# Patient Record
Sex: Female | Born: 1935 | Race: White | Hispanic: No | Marital: Married | Smoking: Former smoker
Health system: Southern US, Community
[De-identification: ages and names within clinical notes are randomized; demographics above are authoritative.]

## PROBLEM LIST (undated history)

## (undated) DIAGNOSIS — M797 Fibromyalgia: Secondary | ICD-10-CM

## (undated) HISTORY — PX: APPENDECTOMY: SHX54

## (undated) HISTORY — PX: PACEMAKER INSERTION: SHX728

## (undated) HISTORY — PX: CHOLECYSTECTOMY: SHX55

## (undated) HISTORY — PX: ABDOMINAL HYSTERECTOMY: SHX81

## (undated) HISTORY — DX: Fibromyalgia: M79.7

## (undated) HISTORY — PX: REPLACEMENT TOTAL KNEE: SUR1224

---

## 2000-08-13 ENCOUNTER — Ambulatory Visit (HOSPITAL_BASED_OUTPATIENT_CLINIC_OR_DEPARTMENT_OTHER): Admission: RE | Admit: 2000-08-13 | Discharge: 2000-08-14 | Payer: Self-pay | Admitting: Orthopedic Surgery

## 2008-03-24 ENCOUNTER — Encounter: Admission: RE | Admit: 2008-03-24 | Discharge: 2008-03-24 | Payer: Self-pay | Admitting: Orthopedic Surgery

## 2008-04-08 ENCOUNTER — Ambulatory Visit (HOSPITAL_BASED_OUTPATIENT_CLINIC_OR_DEPARTMENT_OTHER): Admission: RE | Admit: 2008-04-08 | Discharge: 2008-04-09 | Payer: Self-pay | Admitting: Orthopedic Surgery

## 2010-10-24 NOTE — Op Note (Signed)
[name], [name]                 ACCOUNT NO.:  [id number]   MEDICAL RECORD NO.:  [id number]          PATIENT TYPE:  AMB   LOCATION:  DSC                          FACILITY:  [facility]   PHYSICIAN:  Katy Fitch. Sypher, M.D. DATE OF BIRTH:  July 30, 1935   DATE OF PROCEDURE:  04/08/2008  DATE OF DISCHARGE:                               OPERATIVE REPORT   PREOPERATIVE DIAGNOSES:  1. End-stage right thumb carpometacarpal arthrosis, status post years      of nonoperative management without successful pain relief.  2. Chronic right carpal tunnel syndrome.  3. Chronic right ulnar nerve compression at cubital tunnel.   POSTOPERATIVE DIAGNOSES:  1. End-stage right thumb carpometacarpal arthrosis, status post years      of nonoperative management without successful pain relief.  2. Chronic right carpal tunnel syndrome.  3. Chronic right ulnar nerve compression at cubital tunnel.   OPERATION:  1. Right thumb reconstruction by trapeziectomy and intermetacarpal      ligament reconstruction utilizing a distally-based flexor carpi      radialis tendon graft followed by interposition anchovy type      procedure with remnant of flexor carpi radialis.  2. Suture suspension plasty of right thumb metacarpal.  3. Decompression of right ulnar nerve at cubital tunnel through a      separate incision.  4. Right carpal tunnel release.   OPERATING SURGEON:  Katy Fitch. Sypher, MD   ASSISTANT:  Marveen Reeks Dasnoit, PA-C   ANESTHESIA:  General by LMA with a supplemental right infraclavicular  block.   SUPERVISING ANESTHESIOLOGIST:  Kaylyn Layer. 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  She has had a history of  bilateral thumb CMC arthrosis, status post reconstruction of her left  thumb in 2002.  Recently, we have treated her for right carpal tunnel  syndrome, right ulnar nerve compression at the cubital tunnel, and end-  stage right  thumb CMC arthrosis.   After years of deliberation, she decided to proceed with reconstruction  of right thumb CMC joint at this time.  Electodiagnostic studies reveal  right median and ulnar neuropathy unresponsive to non operative  measures.   On the left, she required stabilization of her thumb MP joint due to  hyperextension.  On the right, she has a stable right thumb MP joint.   After informed consent, she was brought to the operating room at this  time.   PROCEDURE:  [name] was brought to the operating room and placed in  supine position upon the operating table.   Following an anesthesia consult, an infraclavicular block was placed  with only partial anesthesia.   [name] was brought to room 4, placed in supine position upon the  operating table, and under Dr. Deirdre Priest direct supervision, general  anesthesia by LMA technique induced.   The right arm was prepped with Betadine soap solution and sterilely  draped.  Vancomycin 1 g was administered over 90 minutes as an IV  prophylactic antibiotic.   The arm was exsanguinated  with an Esmarch bandage.  Arterial tourniquet  was inflated to 230 mmHg.  Procedure commenced with a standard carpal  tunnel release through a small palmar incision.  Subcutaneous tissues  were carefully divided taking care to identify the palmar fascia.  This  was split longitudinally to reveal the common sensory branch of the  median nerve.  These were followed back to transverse carpal ligament,  which was then gently separated from median nerve.  The ligament was  released along its ulnar border extending into the distal forearm.  This  widely opened the carpal canal.  We noted that the palmaris longus was a  very small caliber; therefore, it was not suitable for tendon graft with  the intermetacarpal ligament reconstruction.  The carpal tunnel wound  was then inspected for bleeding points and repaired with intradermal  through a Prolene.    Attention was then directed to the medial right elbow.  A 4-cm incision  was fashioned posterior to the epicondyle.  We went through subcutaneous  tissue until the arcuate ligament was identified.  This released as was  [employer] band.  The ulnar nerve was decompressed in situ 6 cm above the  epicondyle and 6 cm distal to the epicondyle.  This wound was then  inspected for bleeding points and repaired with subcutaneous suture of 4-  0 Vicryl and intradermal through a Prolene suture.   Attention was then directed to the thumb.  A Wagner curvilinear incision  was fashioned exposing the thenar muscles.  Interval between the  abductor pollicis longus and extensor pollicis brevis was split  longitudinally beyond the capsule of the CMC joint.  The trapezium was  exposed subperiosteally, morcellized, and removed in piecemeal.  The  complete synovectomy of the joint was accomplished.  The radial artery  was protected.  Drill holes were created at appropriate angles to the  base of the thumb metacarpal and at the base of the index metacarpal.  Two-thirds the flexor carpi radialis was harvested at the  musculotendinous junction, brought distally with a Carroll tendon  passing forceps and then into the cavity created by resection of the  trapezium.  This was then invaginated into the drill hole in the index  metacarpal and secured with a 2-0 Vicryl suture to the extensor carpi  radialis brevis.  The thumb metacarpal was then suspended with a #1  Vicryl two strand suture brought from dorsal through the index  metacarpal to the base of the thumb metacarpal and sutured to the  abductor pollicis longus insertion at the base of the metacarpal.  The  flexor carpi radialis was then brought through the base of the thumb  metacarpal and secured to the abductor pollicis longus creating an  intermetacarpal ligament.  The 5-cm long remnant was repeatedly wrapped  around the remaining flexor carpi radialis and  sutured to the capsule of  the thumb carpometacarpal joint on its most volar aspect, creating an  excellent interposition.   This wound was then closed by repairing the 2 slips of the abductor  pollicis longus to each other with mattress suture of 3-0 Ethibond.  The  tourniquet was released and all bleeding controlled by bipolar forceps.  The skin wounds were repaired with subcutaneous suture of 4-0 Vicryl and  intradermal through a Prolene.   A voluminous dressing was applied.  The tourniquet released with  immediate capillary refill to the fingers and thumb.  There were no  apparent complications.  Katy Fitch Sypher, M.D.  Electronically Signed     RVS/MEDQ  D:  04/08/2008  T:  04/09/2008  Job:  [id number]

## 2010-10-27 NOTE — Op Note (Signed)
[facility]  Patient:    [name], WAHID                          MRN: [id number] Proc. Date: 08/13/00 Adm. Date:  [id number] Attending:  Susa Day                           Operative Report  PREOPERATIVE DIAGNOSIS:  Severe left thumb carpometacarpal joint arthrosis with bone-on-bone arthritis between thumb metacarpal and trapezium and 30 degree hyperextension instability of left thumb metacarpophalangeal joint.  POSTOPERATIVE DIAGNOSIS:  Severe left thumb carpometacarpal joint arthrosis with bone-on-bone arthritis between thumb metacarpal and trapezium and 30 degree hyperextension instability of left thumb metacarpophalangeal joint.  OPERATION PERFORMED: 1. Excision of left trapezium and reconstruction of intermetacarpal ligament    between index and thumb metacarpals with free palmaris longus tendon graft    reconstruction. 2. Abductor pollicis longus to second metacarpal transfer for suspension of    left thumb. 3. Through a separate incision, excision of ulnar sesamoid and abductor    pollicis advancement and volar plate plication to stabilize    metatarsophalangeal joint hyperextension. 4. 0.045 inch Kirschner wire fixation of left thumb metatarsophalangeal joint    at 35 degrees flexion.  SURGEON:  Katy Fitch. Sypher, Montez Hageman., M.D.  ASSISTANT:  Jonni Sanger, P.A.  ANESTHESIA:  Axillary block supplemented by intravenous sedation.  SUPERVISING ANESTHESIOLOGIST:  Dr. Diamantina Monks.  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  She has had a chronic history of bilateral thumb CMC arthrosis that has of late, severely impaired her ability to play music and to do household and professional activities with her hands.  She has sought a number of orthopedic consults including a consult with Dr. Chales Abrahams at the [facility] and was advised to  undergo soft tissue interposition arthroplasty reconstruction of her thumbs.  Unfortunately, due to scheduling difficulties, use of herbal medications and an episode of influenza she had to cancel her surgery x 2.  She researched hand surgeons who lived closer to her home in [place], [place] and sought an alternative hand surgery consult with our practice.  We advised them of our routine technique of soft tissue interposition reconstruction of the thumb and MP stabilization, given her anatomic circumstances.  They elected to transfer care to [facility] and at this time she is brought to the operating room for reconstruction of her left thumb.  Preoperatively, she was advised of potential risks and benefits of surgery including specific risks of development of reflex sympathetic dystrophy postoperatively, neurovascular injury during surgery, infection of her pin tracks or wound infection and possible disruption of her tendon graft reconstruction.  Accepting these risks and after answering all of her questions, she proceeds with reconstructive surgery at this time.  DESCRIPTION OF PROCEDURE:  [name] was brought to the operating room and placed in supine position on the operating table.  Following placement of an axillary block in the holding area by Dr. Katrinka Blazing, anesthesia was satisfactory in the median and ulnar distribution of the left arm, but somewhat spotty in the radial distribution.  After routine Betadine scrub and paint, the left arm was draped with sterile stockinet and arthroscopy drapes followed by exsanguination of the limb with Esmarch bandage and inflation of arterial tourniquet to 250 mmHg. Preoperatively, 1 gm of  vancomycin was administered as an IV prophylactic antibiotic.  After sensory testing, 2% lidocaine was infiltrated along the path of the radial sensory branches.  The procedure commenced with a curvilinear Wagner incision at the base of the thumb.  The  subcutaneous tissues were carefully divided taking care to identify the radial sensory branches.  The interval between the abductor pollicis longus and the extensor pollicis brevis tendons was sharply incised revealing the [facility] joint capsule.  The capsule was elevated along the distal margin of the trapezium and the trapezium was exposed circumferentially with subperiosteal dissection.  The trapesium was morselized with a rongeur and completely removed followed by complete synovectomy of the [facility] joint.  The osteophyte on the palmar aspect of the thumb metacarpal was removed with a rongeur.  The palmaris longus tendon was harvested through a separate transverse incision at the distal wrist flexion crease with a Brand tendon stripper.  This was technically satisfactory and a 5 mm palmaris longus was recovered.  This was cleared of all muscular tissue and placed in a saline soaked sponge for reconstruction at a later point in the procedure.  The wound was repaired with intradermal 3-0 Prolene.  An oblique drill hole was created through the base of the thumb metacarpal beginning 1 cm distal to the articular surface proximally and brought out through the base of the thumb metacarpal adjacent to the volar beak of the thumb metacarpal.  A second drill hole was created through the base of the index metacarpal utilizing the facet formerly articulating with the thumb metacarpal.  Both drill holes were enlarged to 4.5 mm with sequential drilling.  The palmaris longus was then looped through a dorsal incision at the base of the index metacarpal through the extensor carpi radialis longus and brought through the second metacarpal drill hole to the cavity created by the trapezium excision.  A long slip of this tendon was placed through the base of the thumb metacarpal, brought around 360 degrees through the drill hole at the base of the metacarpal creating a tendon loop at the base of the  thumb metacarpal.  The palmaris longus was then tensioned to properly suspend the thumb off of the index metacarpal and the second loop was passed through the hole at the  base of the thumb metacarpal and appropriately tensioned and sutured to the dorsal periosteum at the base of the thumb metacarpal.  One third of the abductor pollicis longus was released from the musculotendinous junction on the dorsal slip and split distally, brought through the first dorsal compartment and split to the base of the thumb metacarpal insertion.  This was then placed deep to the loop of the palmaris longus at the base of the thumb metacarpal and brought through the drill hole at the base of the second metacarpal.  This was properly tensioned to suspend the thumb in a satisfactory manner and subsequently secured into the extensor carpi radialis longus with a Pulvertaft weave x 3 and corner sutures of 3-0 Ethibond. A very satisfactory suspensionplasty was achieved.  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  The free tails of the tendon were then woven into the capsule and secured with multiple interrupted sutures of 3-0 Ethibond.  Attention was then directed to the thumb metacarpophalangeal joint.  A volar Brunner zigzag incision  was used to expose the flexor sheath overlying the oblique pulley and A-1 pulley.  The A-1 pulley was released and the flexor pollicis longus tendon retracted in a radial direction.  The ulnar sensorimotor was identified and a football-shaped portion of capsule and tendon overlying the ulnar sesamoid was excised with the sesamoid.  The MP joint was then  placed in 35 degrees flexion and secured with a 0.045 inch Kirschner wire followed by  advancement of the abductor pollicis tendon approximately 4 mm and securing it to the distal portion of the volar plate. The portion of the plate excised was advanced, imbricating the volar plate and securing the MP joint in flexion.  This should correct the tendency for hyperextension at the MP joint satisfactorily.  C-arm fluoroscopic images were obtained documenting satisfactory suspension of the the thumb metacarpal and proper position of the Kirschner wire at the thumb metacarpophalangeal joint.  The wounds were then thoroughly lavaged with sterile saline followed by triple antibiotic solution.  All skin wounds were then repaired with intradermal 3-0 Prolene and Steri-Strips.  The tourniquet was released at approximately one hour and 35 minutes with excellent capillary refill in all the fingers and thumb immediately.  The wounds were dressed with Xeroflo, sterile gauze and a volar and dorsal plaster splint protecting the thumb in a palmar abducted position.  Ace wrap was applied for light compression.  There were no apparent complications.  For aftercare, Ms. Cutbirth will be [facility] for observation of her vital signs and IV vancomycin as a prophylactic antibiotic x 24 hours. DD:  08/13/00 TD:  08/13/00 Job: [id number]

## 2011-03-13 LAB — POCT HEMOGLOBIN-HEMACUE: Hemoglobin: 14.3

## 2015-08-16 ENCOUNTER — Ambulatory Visit (HOSPITAL_COMMUNITY): Payer: Medicare Other | Admitting: Hematology & Oncology

## 2015-08-24 ENCOUNTER — Ambulatory Visit (HOSPITAL_COMMUNITY): Payer: Medicare Other | Admitting: Hematology & Oncology

## 2015-09-05 ENCOUNTER — Encounter (HOSPITAL_COMMUNITY): Payer: Self-pay | Admitting: Hematology & Oncology

## 2015-09-05 ENCOUNTER — Encounter (HOSPITAL_COMMUNITY): Payer: Medicare Other

## 2015-09-05 ENCOUNTER — Encounter (HOSPITAL_COMMUNITY): Payer: Medicare Other | Attending: Hematology & Oncology | Admitting: Hematology & Oncology

## 2015-09-05 VITALS — BP 159/75 | HR 88 | Temp 98.2°F | Resp 18 | Ht 63.0 in | Wt 176.0 lb

## 2015-09-05 DIAGNOSIS — Z950 Presence of cardiac pacemaker: Secondary | ICD-10-CM | POA: Diagnosis not present

## 2015-09-05 DIAGNOSIS — E8809 Other disorders of plasma-protein metabolism, not elsewhere classified: Secondary | ICD-10-CM

## 2015-09-05 DIAGNOSIS — M797 Fibromyalgia: Secondary | ICD-10-CM | POA: Diagnosis not present

## 2015-09-05 DIAGNOSIS — Z96659 Presence of unspecified artificial knee joint: Secondary | ICD-10-CM | POA: Insufficient documentation

## 2015-09-05 DIAGNOSIS — D72819 Decreased white blood cell count, unspecified: Secondary | ICD-10-CM

## 2015-09-05 DIAGNOSIS — Z79899 Other long term (current) drug therapy: Secondary | ICD-10-CM | POA: Diagnosis not present

## 2015-09-05 DIAGNOSIS — Z87891 Personal history of nicotine dependence: Secondary | ICD-10-CM | POA: Diagnosis not present

## 2015-09-05 DIAGNOSIS — Z880 Allergy status to penicillin: Secondary | ICD-10-CM | POA: Insufficient documentation

## 2015-09-05 DIAGNOSIS — D72810 Lymphocytopenia: Secondary | ICD-10-CM | POA: Insufficient documentation

## 2015-09-05 DIAGNOSIS — K7460 Unspecified cirrhosis of liver: Secondary | ICD-10-CM | POA: Insufficient documentation

## 2015-09-05 DIAGNOSIS — Z9889 Other specified postprocedural states: Secondary | ICD-10-CM | POA: Insufficient documentation

## 2015-09-05 DIAGNOSIS — Z888 Allergy status to other drugs, medicaments and biological substances status: Secondary | ICD-10-CM | POA: Insufficient documentation

## 2015-09-05 DIAGNOSIS — R17 Unspecified jaundice: Secondary | ICD-10-CM

## 2015-09-05 DIAGNOSIS — D693 Immune thrombocytopenic purpura: Secondary | ICD-10-CM | POA: Diagnosis present

## 2015-09-05 DIAGNOSIS — D696 Thrombocytopenia, unspecified: Secondary | ICD-10-CM

## 2015-09-05 DIAGNOSIS — F329 Major depressive disorder, single episode, unspecified: Secondary | ICD-10-CM | POA: Insufficient documentation

## 2015-09-05 LAB — CBC
HCT: 43 % (ref 36.0–46.0)
HEMOGLOBIN: 14.2 g/dL (ref 12.0–15.0)
MCH: 30.2 pg (ref 26.0–34.0)
MCHC: 33 g/dL (ref 30.0–36.0)
MCV: 91.5 fL (ref 78.0–100.0)
PLATELETS: 42 10*3/uL — AB (ref 150–400)
RBC: 4.7 MIL/uL (ref 3.87–5.11)
RDW: 14.8 % (ref 11.5–15.5)
WBC: 4 10*3/uL (ref 4.0–10.5)

## 2015-09-05 LAB — DIFFERENTIAL
Basophils Absolute: 0 10*3/uL (ref 0.0–0.1)
Basophils Relative: 0 %
EOS PCT: 1 %
Eosinophils Absolute: 0 10*3/uL (ref 0.0–0.7)
LYMPHS ABS: 0.8 10*3/uL (ref 0.7–4.0)
LYMPHS PCT: 19 %
MONO ABS: 0.2 10*3/uL (ref 0.1–1.0)
Monocytes Relative: 7 %
NEUTROS PCT: 74 %
Neutro Abs: 3 10*3/uL (ref 1.7–7.7)

## 2015-09-05 LAB — IRON AND TIBC
Iron: 93 ug/dL (ref 28–170)
SATURATION RATIOS: 30 % (ref 10.4–31.8)
TIBC: 311 ug/dL (ref 250–450)
UIBC: 218 ug/dL

## 2015-09-05 LAB — FERRITIN: Ferritin: 104 ng/mL (ref 11–307)

## 2015-09-05 LAB — BILIRUBIN, FRACTIONATED(TOT/DIR/INDIR)
BILIRUBIN TOTAL: 2 mg/dL — AB (ref 0.3–1.2)
Bilirubin, Direct: 0.5 mg/dL (ref 0.1–0.5)
Indirect Bilirubin: 1.5 mg/dL — ABNORMAL HIGH (ref 0.3–0.9)

## 2015-09-05 LAB — C-REACTIVE PROTEIN: CRP: 0.5 mg/dL (ref <1.0)

## 2015-09-05 LAB — SEDIMENTATION RATE: Sed Rate: 4 mm/hr (ref 0–22)

## 2015-09-05 LAB — VITAMIN B12: VITAMIN B 12: 960 pg/mL — AB (ref 180–914)

## 2015-09-05 LAB — FOLATE: Folate: 29.2 ng/mL (ref >5.9)

## 2015-09-05 NOTE — Progress Notes (Signed)
[place]  CONSULT NOTE  No care team member to display  CHIEF COMPLAINTS/PURPOSE OF CONSULTATION:  Thrombocytopenia Platelet count on 07/07/2015 of 35,000, total white blood cell count 3,200 with mild lymphopenia Platelet count on 09/30/2014 of 51,000, total white blood count of 2,900, with mild lymphopenia. Platelet count on 02/08/2015 of 77,000, total white count of 3500, mild lymphopenia. Laboratory studies over the same time. Also show elevated total bilirubin of 2.3 mg/dL, 1.5 mg/dL, and 2 mg/dL Hypoalbuminemia, low total protein History of cirrhosis  HISTORY OF PRESENTING ILLNESS:  [name] 80 y.o. female is here because of a referral by [name] for thrombocytopenia evaluation.   [name] is accompanied by her husband.   This morning she experienced some confusion, waking up and not recognizing her husband instead believing he was a strange woman getting into her bed.   The patient had a knee replacement nearly two years ago. She has fallen after this replacement. She continues to have pain, swelling, and bruising in her knee. She has seen improvement since her replacement as she is now able to walk. States her leg is still bad and required assistance onto the exam table. She still experiences pain in her joints but refuses to take a lot of pain medication for it.  She saw about 4 or 5 hematologists before her knee replacement due to her abnormal blood counts. Her most in depth work up was at the [facility] [place] [facility] about 4 or 5 years ago, she would not let them do a bone marrow biopsy at the time as she read many negative things about them and was anxious.  Her falls are due to heart problems and she has still fallen since her pacemaker was placed one year ago. States she is not "sportsy" and falls over everything.   She has no issues with her liver that she knows of. She believes an ultrasound was done of her liver about 3 years ago.   She gets  urinary tract infections "all the time" for the last 10 to 12 years.   She has been told that her abnormal blood counts may be due to her immune system. She was told by a fibromyalgia doctor, Dr. Estanislado Hendrix, in [place]. She has not seen Dr. Estanislado Hendrix in about 3 years.  The patient reports she is anxious about labs as she has had several surgeries. She is anxious about having a bone marrow biopsy performed but is agreeable with the procedure if she is under sedation. States, "I'm scared" when told blood work will be done today.  Reports she received platelets previously for a long duration without her counts improving. She has previously been checked for lupus.  Reports fatigue and depression. Stating, "it is a heartache of a condition", further explaining "I feel very helpless in this". She does not believe there is any hope as she has had this problem for so long.   She continues to receive screening mammograms.   Her husband is planning a mission trip to [place] over the summer, however he has not bought tickets yet as he wants to be able to take care of his wife.   The patient is here for further evaluation of abnormal blood counts.  From [facility]: Bearl Mulberry, MD - 03/31/2013 2:58 PM EDT Formatting of this note may be different from the original. [place]: The patient is referred for consultation regarding management of thrombocytopenia before planned knee replacement surgery.  HISTORY OF  PRESENT ILLNESS: [name] is a 80 year old woman, who desperately needs a right knee replacement. This has been complicated by the finding of thrombocytopenia for which she comes to see me today. The patient reports that she first was told about low blood counts approximately 7 years ago, although she reports being told white count was low. She first recalls being told about low platelets about a year and half ago when she first started being evaluated for her knee surgery. She  originally was going to have her knee replaced at [facility]. She went through their intake system but her surgery was stopped because of the finding of a low platelet count, she recalls 43,000 was the count. She was seen by Hematology at [facility] although, she does not remember the name of a hematologist or the findings. She says that they gave her a differential of a hereditary condition versus immune thrombocytopenic purpura, versus possible liver problems. She was treated with steroids 240 mg a day; however, she stopped this after about 5 or 6 days because of complications on steroids. She did not, to her knowledge get a repeat platelet count at that time; therefore, there is no information regarding whether a steroid challenge was helpful. The patient has also been seen by Hematology in [place]. Unfortunately, I do not have records from either of these institutions, but will try to get them. The hematologist in [place] thought the steroids should not be used according to the patient. She recommended an overwhelming dose of platelets and try the surgery. The patient was admitted to hospital in [facility] with a surgeon who was going to do the knee replacement if she could get her platelet count to 90,000 or greater. Per her report, she received 36 units of platelets overnight and unfortunately her platelet count did not get higher than 70,000 (per patient report). Therefore the surgery was canceled. Notably, the patient has had a high bilirubin which I see reported for at least a year and she reports that it has been elevated for at least 10 years. She was told by at least 1 individual that she had liver cirrhosis and she reports that she had a liver biopsy 6-7 years ago and was told she had fatty liver disease. She has had multiple liver ultrasounds performed per her report, although, again I will need to get those records. The patient believes that she has immune thrombocytopenic purpura. She has never had  a bone marrow biopsy done. She has a syndrome of chronic diarrhea. She had an upper and lower endoscopy performed a year ago and was told both were normal. She has done stool hemoccults that have been negative. Another notable finding is that she has had persistent low protein. She reports that her appetite is not good and that she does not eat well. She has brought a copy of laboratories performed from March 16, 2013 which include a creatinine of 0.5 and albumin 3.0, total protein of 5.8, AST 40, ALT 30, alkaline phosphatase 72, total bilirubin 1.4. White blood cell count 4.0, hemoglobin 12.4, hematocrit 37.4, platelet count, 87,000. She also reports that in February of this year, she fell because of her knee problem and broke her right hip. She has noted some swelling in that leg since that time. She denies any acute redness or warmth in the leg. She denies any shortness of breath or chest pain. She denies nose bleeds, blood in her urine or blood in her stool. She has some minor bruising  on her arms, she bruises a little where blood is drawn, but otherwise she denies any excessive bruising despite this history of lower platelet counts. She notes that she tends to be allergic to almost every antibiotic. Surgery in the past have included partial hysterectomy, thumb surgery, tonsillectomy, bladder suspension x2, tubal ligation, gall bladder, none of which were complicated by bleeding.    MEDICAL HISTORY:  Past Medical History  Diagnosis Date  . Fibromyalgia     SURGICAL HISTORY: Past Surgical History  Procedure Laterality Date  . Pacemaker insertion    . Replacement total knee      right knee  . Appendectomy    . Cholecystectomy    . Abdominal hysterectomy      SOCIAL HISTORY: Social History   Social History  . Marital Status: Married    Spouse Name: N/A  . Number of Children: N/A  . Years of Education: N/A   Occupational History  . Not on file.   Social History Main Topics  .  Smoking status: Former Smoker -- 0.25 packs/day    Types: Cigarettes    Quit date: 03/20/1956  . Smokeless tobacco: Not on file  . Alcohol Use: No  . Drug Use: No  . Sexual Activity: Not on file   Other Topics Concern  . Not on file   Social History Narrative  . No narrative on file  Married for 55 years 4 children 11 grandchildren 1 great grandchild She previously worked as an Web designer with a Pensions consultant. Her husband is a Company secretary. Ex smoker, "when I was very young" ETOH, none She enjoys reading and she is a Therapist, nutritional. She plays piano.  2 of her children live within an hour of here.  FAMILY HISTORY: Family History  Problem Relation Age of Onset  . Alzheimer's disease Sister    Mother deceased at 40 yo of strangulation while eating. Aspiration pneumonia. Father deceased at 53 yo, he had issues with blood pressure but she is not sure what he died from. 1 sister who passed with Alzheimers at 67 yo  ALLERGIES:  is allergic to codeine; penicillins; sulfa antibiotics; and tramadol.  MEDICATIONS:  Current Outpatient Prescriptions  Medication Sig Dispense Refill  . acetaminophen (TYLENOL) 325 MG tablet     . amitriptyline (ELAVIL) 50 MG tablet   4  . cephALEXin (KEFLEX) 500 MG capsule   0  . co-enzyme Q-10 30 MG capsule     . esomeprazole (NEXIUM) 40 MG capsule Take by mouth.    . Ferrous Sulfate (IRON) 90 (18 Fe) MG TABS     . Sesame Oil OIL     . terconazole (TERAZOL 7) 0.4 % vaginal cream   3  . UNABLE TO FIND     . vitamin B-12 (CYANOCOBALAMIN) 50 MCG tablet     . Vitamins/Minerals TABS     . zolpidem (AMBIEN CR) 12.5 MG CR tablet Take by mouth.     No current facility-administered medications for this visit.    Review of Systems  Constitutional: Positive for malaise/fatigue.  HENT: Negative.   Eyes: Negative.   Respiratory: Negative.   Cardiovascular: Negative.   Gastrointestinal: Positive for heartburn and blood in stool. Negative for  nausea, vomiting, abdominal pain, diarrhea, constipation and melena.       Blood in stool related to hemorrhoids.  Genitourinary: Negative.   Musculoskeletal: Positive for back pain, joint pain and falls.  Skin: Negative.   Neurological: Negative.   Endo/Heme/Allergies: Negative.  Does  not bruise/bleed easily.  Psychiatric/Behavioral: Positive for depression. Negative for suicidal ideas, hallucinations, memory loss and substance abuse. The patient is nervous/anxious. The patient does not have insomnia.        She is anxious about labs and surgeries. She feels helpless regarding her illness.  All other systems reviewed and are negative.  14 point ROS was done and is otherwise as detailed above or in HPI   PHYSICAL EXAMINATION: ECOG PERFORMANCE STATUS: 1 - Symptomatic but completely ambulatory  Filed Vitals:   09/05/15 1441  BP: 159/75  Pulse: 88  Temp: 98.2 F (36.8 C)  Resp: 18   Filed Weights   09/05/15 1441  Weight: 176 lb (79.833 kg)    Physical Exam  Constitutional: She is oriented to person, place, and time and well-developed, well-nourished, and in no distress.  Able to get on exam table with assistance.  HENT:  Head: Normocephalic and atraumatic.  Nose: Nose normal.  Mouth/Throat: Oropharynx is clear and moist. No oropharyngeal exudate.  Eyes: Conjunctivae and EOM are normal. Pupils are equal, round, and reactive to light. Right eye exhibits no discharge. Left eye exhibits no discharge. No scleral icterus.  Neck: Normal range of motion. Neck supple. No tracheal deviation present. No thyromegaly present.  Cardiovascular: Normal rate, regular rhythm and normal heart sounds.  Exam reveals no gallop and no friction rub.   No murmur heard. Pulmonary/Chest: Effort normal and breath sounds normal. She has no wheezes. She has no rales.  Abdominal: Soft. Bowel sounds are normal. She exhibits no distension and no mass. There is no tenderness. There is no rebound and no  guarding.  Musculoskeletal: Normal range of motion. She exhibits no edema.  Lymphadenopathy:    She has no cervical adenopathy.  Neurological: She is alert and oriented to person, place, and time. She has normal reflexes. No cranial nerve deficit. Gait normal. Coordination normal.  Skin: Skin is warm and dry. No rash noted.  Psychiatric: Mood, memory, affect and judgment normal.  Nursing note and vitals reviewed.   LABORATORY DATA:  I have reviewed the data as listed Lab Results  Component Value Date   HGB 14.3 POINT OF CARE RESULT 04/08/2008   Laboratory studies are reviewed and as detailed under reason for visit.  ASSESSMENT & PLAN:  Thrombocytopenia Platelet count on 07/07/2015 of 35,000, total white blood cell count 3,200 with mild lymphopenia Platelet count on 09/30/2014 of 51,000, total white blood count of 2,900, with mild lymphopenia. Platelet count on 02/08/2015 of 77,000, total white count of 3500, mild lymphopenia. Laboratory studies over the same time. Also show elevated total bilirubin of 2.3 mg/dL, 1.5 mg/dL, and 2 mg/dL Hypoalbuminemia, low total protein History of cirrhosis  80 year old female with long-standing thrombocytopenia and leukopenia. Records from [facility] were reviewed. 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  Per records was treated with high-dose steroids, response not available and patient does not recall.  She reports a long-term infusion of platelets for many hours, I wonder if this was IVIG? She has never had any other formal treatment.   We reviewed indications for treatment of ITP and treatment options including promacta and Nplate.  She has a leukopenia and there is mention in her records of underlying liver disease. She has not elevated total bilirubin that is persistent. I think this clearly needs to be cleared up. She may have underlying gilbert's, but will evaluate with abdominal ultrasound to rule out underlying liver disease.  I discussed with the patient  obtaining the following  laboratory studies as detailed below today.  Orders Placed This Encounter  Procedures  . US Abdomen Complete    Order Specific Question:  Reason for Exam (SYMPTOM  OR DIAGNOSIS REQUIRED)    Answer:  abnormal liver functions, thrombocytopenia    Order Specific Question:  Preferred imaging location?    Answer:  [facility]  . Systemic Lupus Profile A    Standing Status: Future     Number of Occurrences: 1     Standing Expiration Date: 09/04/2016  . Other/Misc lab test    Order Specific Question:  Test name / description:    Answer:  systemic lupus profile A  . IgG, IgA, IgM  . Ferritin  . Vitamin B12  . Folate  . Iron and TIBC  . Pathologist smear review  . Systemic Lupus Profile A  . Sedimentation rate  . C-reactive protein  . Bilirubin, fractionated(tot/dir/indir)  . Differential  . CBC   I discussed a bone marrow biopsy, to the patient in detail. I advised her that she could be sedated. Once, hearing that she could have sedation,she was open to the procedure. Therefore, if needed we will arrange for the procedure at [facility]. She will follow-up will be made once all laboratory studies are reviewed and additional recommendations are given.  All questions were answered. The patient knows to call the clinic with any problems, questions or concerns.  This document serves as a record of services personally performed by Ancil Linsey, MD. It was created on her behalf by Arlyce Harman, a trained medical scribe. The creation of this record is based on the scribe's personal observations and the provider's statements to them. This document has been checked and approved by the attending provider.  I have reviewed the above documentation for accuracy and completeness, and I agree with the above.  This note was electronically signed.    Molli Hazard, MD  09/05/2015 3:27 PM

## 2015-09-05 NOTE — Patient Instructions (Addendum)
[place] at [facility] Discharge Instructions  RECOMMENDATIONS MADE BY THE CONSULTANT AND ANY TEST RESULTS WILL BE SENT TO YOUR REFERRING PHYSICIAN.   Exam and discussion by Dr Whitney Muse today Blood work today, we will call you with those results  If we call you and tell you that you need a BMBX it would be completed at [facility], they will make you sleepy and it will take about 10 minutes, and you will go home with a band-aid.  You will need a driver that day. Your blood is made in your bone marrow so it would be really hard to determine the problem if that piece is missing. If you have any abnormal bleeding (gum bleeds that are abnormal or nose bleeds) that you cant control, or abnormal bruising on your trunk of your body please call us and let us know. Abdominal Ultrasound scheduled  Return to see the doctor in 2 weeks  Please call the clinic if you have any questions or concerns       Bone Marrow Aspiration and Bone Marrow Biopsy Bone marrow aspiration and bone marrow biopsy are procedures that are done to diagnose blood disorders. You may also have one of these procedures to help diagnose infections or some types of cancer. Bone marrow is the soft tissue that is inside your bones. Blood cells are produced in bone marrow. For bone marrow aspiration, a sample of tissue in liquid form is removed from inside your bone. For a bone marrow biopsy, a small core of bone marrow tissue is removed. Then these samples are examined under a microscope or tested in a lab. You may need these procedures if you have an abnormal complete blood count (CBC). The aspiration or biopsy sample is usually taken from the top of your hip bone. Sometimes, an aspiration sample is taken from your chest bone (sternum). LET [facility] CARE PROVIDER KNOW ABOUT:  Any allergies you have.  All medicines you are taking, including vitamins, herbs, eye drops, creams, and over-the-counter  medicines.  Previous problems you or members of your family have had with the use of anesthetics.  Any blood disorders you have.  Previous surgeries you have had.  Any medical conditions you may have.  Whether you are pregnant or you think that you may be pregnant. RISKS AND COMPLICATIONS Generally, this is a safe procedure. However, problems may occur, including:  Infection.  Bleeding. BEFORE THE PROCEDURE  Ask your health care provider about:  Changing or stopping your regular medicines. This is especially important if you are taking diabetes medicines or blood thinners.  Taking medicines such as aspirin and ibuprofen. These medicines can thin your blood. Do not take these medicines before your procedure if your health care provider instructs you not to.  Plan to have someone take you home after the procedure.  If you go home right after the procedure, plan to have someone with you for 24 hours. PROCEDURE   An IV tube may be inserted into one of your veins.  The injection site will be cleaned with a germ-killing solution (antiseptic).  You will be given one or more of the following:  A medicine that helps you relax (sedative).  A medicine that numbs the area (local anesthetic).  The bone marrow sample will be removed as follows:  For an aspiration, a hollow needle will be inserted through your skin and into your bone. Bone marrow fluid will be drawn up into a syringe.  For  a biopsy, your health care provider will use a hollow needle to remove a core of tissue from your bone marrow.  The needle will be removed.  A bandage (dressing) will be placed over the insertion site and taped in place. The procedure may vary among health care providers and hospitals. AFTER THE PROCEDURE  Your blood pressure, heart rate, breathing rate, and blood oxygen level will be monitored often until the medicines you were given have worn off.  Return to your normal activities as  directed by your health care provider.   This information is not intended to replace advice given to you by your health care provider. Make sure you discuss any questions you have with your health care provider.   Document Released: 05/31/2004 Document Revised: 10/12/2014 Document Reviewed: 05/19/2014 Elsevier Interactive Patient Education 2016 [employer].       Thank you for choosing [place] at [facility] to provide your oncology and hematology care.  To afford each patient quality time with our provider, please arrive at least 15 minutes before your scheduled appointment time.   Beginning January 23rd 2017 lab work for the [facility] will be done in the  Main lab at [facility] on 1st floor. If you have a lab appointment with the [facility] please come in thru the  Main Entrance and check in at the main information desk  You need to re-schedule your appointment should you arrive 10 or more minutes late.  We strive to give you quality time with our providers, and arriving late affects you and other patients whose appointments are after yours.  Also, if you no show three or more times for appointments you may be dismissed from the clinic at the providers discretion.     Again, thank you for choosing [facility].  Our hope is that these requests will decrease the amount of time that you wait before being seen by our physicians.       _____________________________________________________________  Should you have questions after your visit to [facility], please contact our office at [phone] between the hours of 8:30 a.m. and 4:30 p.m.  Voicemails left after 4:30 p.m. will not be returned until the following business day.  For prescription refill requests, have your pharmacy contact our office.         Resources For Cancer Patients and their Caregivers ? American Cancer Society: Can assist with transportation, wigs,  general needs, runs Look Good Feel Better.        364 868 8470 ? Cancer Care: Provides financial assistance, online support groups, medication/co-pay assistance.  1-800-813-HOPE 6097235531) ? [place] Assists [place] cancer patients and their families through emotional , educational and financial support.  (437) 626-2507 ? [place] DSS Where to apply for food stamps, Medicaid and utility assistance. 808-664-3006 ? [facility]: Transportation to medical appointments. 514 162 9161 ? Social Security Administration: May apply for disability if have a Stage IV cancer. (223) 312-5416 (336)417-4931 ? [employer], Disability and Transit Services: Assists with nutrition, care and transit needs. 323 742 5864

## 2015-09-06 LAB — MISC LABCORP TEST (SEND OUT): Labcorp test code: 56499

## 2015-09-06 LAB — IGG, IGA, IGM
IGA: 443 mg/dL — AB (ref 64–422)
IGM, SERUM: 94 mg/dL (ref 26–217)
IgG (Immunoglobin G), Serum: 984 mg/dL (ref 700–1600)

## 2015-09-06 LAB — PATHOLOGIST SMEAR REVIEW

## 2015-09-07 ENCOUNTER — Other Ambulatory Visit (HOSPITAL_COMMUNITY): Payer: Medicare Other

## 2015-09-07 ENCOUNTER — Other Ambulatory Visit (HOSPITAL_COMMUNITY): Payer: Self-pay | Admitting: Oncology

## 2015-09-07 ENCOUNTER — Ambulatory Visit (HOSPITAL_COMMUNITY)
Admission: RE | Admit: 2015-09-07 | Discharge: 2015-09-07 | Disposition: A | Discharge status: Home / Self Care | Payer: Medicare Other | Source: Ambulatory Visit | Attending: Hematology & Oncology | Admitting: Hematology & Oncology

## 2015-09-07 DIAGNOSIS — D72819 Decreased white blood cell count, unspecified: Secondary | ICD-10-CM | POA: Diagnosis not present

## 2015-09-07 DIAGNOSIS — R161 Splenomegaly, not elsewhere classified: Secondary | ICD-10-CM | POA: Insufficient documentation

## 2015-09-07 DIAGNOSIS — R17 Unspecified jaundice: Secondary | ICD-10-CM | POA: Insufficient documentation

## 2015-09-07 DIAGNOSIS — D696 Thrombocytopenia, unspecified: Secondary | ICD-10-CM | POA: Diagnosis not present

## 2015-09-08 ENCOUNTER — Encounter (HOSPITAL_BASED_OUTPATIENT_CLINIC_OR_DEPARTMENT_OTHER): Payer: Medicare Other | Admitting: Hematology & Oncology

## 2015-09-08 ENCOUNTER — Encounter (HOSPITAL_COMMUNITY): Payer: Self-pay | Admitting: Hematology & Oncology

## 2015-09-08 VITALS — BP 141/85 | HR 99 | Temp 98.3°F | Resp 18 | Wt 175.0 lb

## 2015-09-08 DIAGNOSIS — E8809 Other disorders of plasma-protein metabolism, not elsewhere classified: Secondary | ICD-10-CM | POA: Diagnosis not present

## 2015-09-08 DIAGNOSIS — D693 Immune thrombocytopenic purpura: Secondary | ICD-10-CM | POA: Diagnosis not present

## 2015-09-08 DIAGNOSIS — M79605 Pain in left leg: Secondary | ICD-10-CM

## 2015-09-08 DIAGNOSIS — D72819 Decreased white blood cell count, unspecified: Secondary | ICD-10-CM

## 2015-09-08 DIAGNOSIS — D72810 Lymphocytopenia: Secondary | ICD-10-CM | POA: Diagnosis not present

## 2015-09-08 DIAGNOSIS — K7460 Unspecified cirrhosis of liver: Secondary | ICD-10-CM

## 2015-09-08 DIAGNOSIS — R61 Generalized hyperhidrosis: Secondary | ICD-10-CM

## 2015-09-08 DIAGNOSIS — R161 Splenomegaly, not elsewhere classified: Secondary | ICD-10-CM

## 2015-09-08 NOTE — Progress Notes (Signed)
[place]  Progress Note  Patient Care Team: Pcp Not In System as PCP - General  CHIEF COMPLAINTS/PURPOSE OF CONSULTATION:  Thrombocytopenia Platelet count on 07/07/2015 of 35,000, total white blood cell count 3,200 with mild lymphopenia Platelet count on 09/30/2014 of 51,000, total white blood count of 2,900, with mild lymphopenia. Platelet count on 02/08/2015 of 77,000, total white count of 3500, mild lymphopenia. Laboratory studies over the same time. Also show elevated total bilirubin of 2.3 mg/dL, 1.5 mg/dL, and 2 mg/dL Hypoalbuminemia, low total protein History of cirrhosis  HISTORY OF PRESENTING ILLNESS:  [name] 80 y.o. female is here because of a referral by Dr. Darlina Sicilian for thrombocytopenia evaluation.   [name] is accompanied by her husband.   She has bad bruising and pain on her left leg. Her pain is in the "fatty, muscular part" of her leg. This is agonizing for her and she fell recently because of this.   She has no appetite. Her husband says that she eats nothing. She drinks boost in the morning which she says "keeps her alive". Her doctor told her to drink 2 a day but she only does this sometimes because they are so expensive.   She said that she is very depressed and "wants to live again". She was advised to go to therapy for this.  She said that last year her knee was healing and she had started to exercise regularly. However one day while she was working out her head began to spin and she fell and hit her head but came to in a minute. She was taken to the hospital and was told that she had a heart condition. She had a pacemaker put in and this "changed her life".  She has been taking 9 sesame seed oil pills a day.  Her ultrasound showed that she had liver disease and cirrhosis.   She has seen several different GI doctors she was advised to pick one of them to help her follow her liver function.  She is 15 minutes from her PCP and will  get her blood checked there in 6 weeks; this will be sent here.   From [facility]: Bearl Mulberry, MD - 03/31/2013 2:58 PM EDT Formatting of this note may be different from the original. [place]: The patient is referred for consultation regarding management of thrombocytopenia before planned knee replacement surgery.  HISTORY OF PRESENT ILLNESS: [name] is a 80 year old woman, who desperately needs a right knee replacement. This has been complicated by the finding of thrombocytopenia for which she comes to see me today. The patient reports that she first was told about low blood counts approximately 7 years ago, although she reports being told white count was low. She first recalls being told about low platelets about a year and half ago when she first started being evaluated for her knee surgery. She originally was going to have her knee replaced at [facility]. She went through their intake system but her surgery was stopped because of the finding of a low platelet count, she recalls 43,000 was the count. She was seen by Hematology at [facility] although, she does not remember the name of a hematologist or the findings. She says that they gave her a differential of a hereditary condition versus immune thrombocytopenic purpura, versus possible liver problems. She was treated with steroids 240 mg a day; however, she stopped this after about 5 or 6 days because of complications on steroids.  She did not, to her knowledge get a repeat platelet count at that time; therefore, there is no information regarding whether a steroid challenge was helpful. The patient has also been seen by Hematology in [place]. Unfortunately, I do not have records from either of these institutions, but will try to get them. The hematologist in [place] thought the steroids should not be used according to the patient. She recommended an overwhelming dose of platelets and try the surgery. The patient was admitted to  hospital in [facility] with a surgeon who was going to do the knee replacement if she could get her platelet count to 90,000 or greater. Per her report, she received 36 units of platelets overnight and unfortunately her platelet count did not get higher than 70,000 (per patient report). Therefore the surgery was canceled. Notably, the patient has had a high bilirubin which I see reported for at least a year and she reports that it has been elevated for at least 10 years. She was told by at least 1 individual that she had liver cirrhosis and she reports that she had a liver biopsy 6-7 years ago and was told she had fatty liver disease. She has had multiple liver ultrasounds performed per her report, although, again I will need to get those records. The patient believes that she has immune thrombocytopenic purpura. She has never had a bone marrow biopsy done. She has a syndrome of chronic diarrhea. She had an upper and lower endoscopy performed a year ago and was told both were normal. She has done stool hemoccults that have been negative. Another notable finding is that she has had persistent low protein. She reports that her appetite is not good and that she does not eat well. She has brought a copy of laboratories performed from March 16, 2013 which include a creatinine of 0.5 and albumin 3.0, total protein of 5.8, AST 40, ALT 30, alkaline phosphatase 72, total bilirubin 1.4. White blood cell count 4.0, hemoglobin 12.4, hematocrit 37.4, platelet count, 87,000. She also reports that in February of this year, she fell because of her knee problem and broke her right hip. She has noted some swelling in that leg since that time. She denies any acute redness or warmth in the leg. She denies any shortness of breath or chest pain. She denies nose bleeds, blood in her urine or blood in her stool. She has some minor bruising on her arms, she bruises a little where blood is drawn, but otherwise she denies any excessive  bruising despite this history of lower platelet counts. She notes that she tends to be allergic to almost every antibiotic. Surgery in the past have included partial hysterectomy, thumb surgery, tonsillectomy, bladder suspension x2, tubal ligation, gall bladder, none of which were complicated by bleeding.    MEDICAL HISTORY:  Past Medical History  Diagnosis Date  . Fibromyalgia     SURGICAL HISTORY: Past Surgical History  Procedure Laterality Date  . Pacemaker insertion    . Replacement total knee      right knee  . Appendectomy    . Cholecystectomy    . Abdominal hysterectomy      SOCIAL HISTORY: Social History   Social History  . Marital Status: Married    Spouse Name: N/A  . Number of Children: N/A  . Years of Education: N/A   Occupational History  . Not on file.   Social History Main Topics  . Smoking status: Former Smoker -- 0.25 packs/day  Types: Cigarettes    Quit date: 03/20/1956  . Smokeless tobacco: Not on file  . Alcohol Use: No  . Drug Use: No  . Sexual Activity: Not on file   Other Topics Concern  . Not on file   Social History Narrative  Married for 29 years 4 children 11 grandchildren 1 great grandchild She previously worked as an Web designer with a Pensions consultant. Her husband is a Company secretary. Ex smoker, "when I was very young" ETOH, none She enjoys reading and she is a Therapist, nutritional. She plays piano.  2 of her children live within an hour of here.  FAMILY HISTORY: Family History  Problem Relation Age of Onset  . Alzheimer's disease Sister    Mother deceased at 63 yo of strangulation while eating. Aspiration pneumonia. Father deceased at 87 yo, he had issues with blood pressure but she is not sure what he died from. 1 sister who passed with Alzheimers at 44 yo  ALLERGIES:  is allergic to codeine; penicillins; sulfa antibiotics; and tramadol.  MEDICATIONS:  Current Outpatient Prescriptions  Medication Sig Dispense Refill    . acetaminophen (TYLENOL) 325 MG tablet     . amitriptyline (ELAVIL) 50 MG tablet   4  . cephALEXin (KEFLEX) 500 MG capsule   0  . co-enzyme Q-10 30 MG capsule     . esomeprazole (NEXIUM) 40 MG capsule Take by mouth.    . Ferrous Sulfate (IRON) 90 (18 Fe) MG TABS     . Sesame Oil OIL     . terconazole (TERAZOL 7) 0.4 % vaginal cream   3  . UNABLE TO FIND     . vitamin B-12 (CYANOCOBALAMIN) 50 MCG tablet     . Vitamins/Minerals TABS     . zolpidem (AMBIEN CR) 12.5 MG CR tablet Take by mouth.     No current facility-administered medications for this visit.    Review of Systems  Constitutional: Positive for malaise/fatigue.       No appetite.  HENT: Negative.   Eyes: Negative.   Respiratory: Negative.   Cardiovascular: Negative.   Gastrointestinal: Negative for nausea, vomiting, abdominal pain, diarrhea, constipation and melena.  Genitourinary: Negative.   Musculoskeletal: Positive for back pain, joint pain and falls.       Bad bruising and pain in left leg  Skin: Negative.   Neurological: Negative.   Endo/Heme/Allergies: Negative.  Does not bruise/bleed easily.  Psychiatric/Behavioral: Positive for depression. Negative for suicidal ideas, hallucinations, memory loss and substance abuse. The patient is nervous/anxious. The patient does not have insomnia.        She is anxious about labs and surgeries. She feels helpless regarding her illness.  All other systems reviewed and are negative.  14 point ROS was done and is otherwise as detailed above or in HPI   PHYSICAL EXAMINATION: ECOG PERFORMANCE STATUS: 1 - Symptomatic but completely ambulatory  Filed Vitals:   09/08/15 1100  BP: 141/85  Pulse: 99  Temp: 98.3 F (36.8 C)  Resp: 18   Filed Weights   09/08/15 1100  Weight: 175 lb (79.379 kg)    Physical Exam  Constitutional: She is oriented to person, place, and time and well-developed, well-nourished, and in no distress.  Able to get on exam table with assistance.   HENT:  Head: Normocephalic and atraumatic.  Nose: Nose normal.  Mouth/Throat: Oropharynx is clear and moist. No oropharyngeal exudate.  Eyes: Conjunctivae and EOM are normal. Pupils are equal, round, and reactive to light. Right  eye exhibits no discharge. Left eye exhibits no discharge. No scleral icterus.  Neck: Normal range of motion. Neck supple. No tracheal deviation present. No thyromegaly present.  Cardiovascular: Normal rate, regular rhythm and normal heart sounds.  Exam reveals no gallop and no friction rub.   No murmur heard. Pulmonary/Chest: Effort normal and breath sounds normal. She has no wheezes. She has no rales.  Abdominal: Soft. Bowel sounds are normal. She exhibits no distension and no mass. There is no tenderness. There is no rebound and no guarding.  Musculoskeletal: Normal range of motion. She exhibits no edema.  Lymphadenopathy:    She has no cervical adenopathy.  Neurological: She is alert and oriented to person, place, and time. She has normal reflexes. No cranial nerve deficit. Gait normal. Coordination normal.  Skin: Skin is warm and dry. No rash noted.  Psychiatric: Mood, memory, affect and judgment normal.  Nursing note and vitals reviewed.   LABORATORY DATA:  I have reviewed the data as listed Results for PERLIE, STENE (MRN [id number]) as of 09/08/2015 08:36  Ref. Range 09/05/2015 16:30  Bilirubin, Direct Latest Ref Range: 0.1-0.5 mg/dL 0.5  Indirect Bilirubin Latest Ref Range: 0.3-0.9 mg/dL 1.5 (H)  Total Bilirubin Latest Ref Range: 0.3-1.2 mg/dL 2.0 (H)  Iron Latest Ref Range: 28-170 ug/dL 93  UIBC Latest Units: ug/dL 218  TIBC Latest Ref Range: 250-450 ug/dL 311  Saturation Ratios Latest Ref Range: 10.4-31.8 % 30  Ferritin Latest Ref Range: 11-307 ng/mL 104  Folate Latest Ref Range: >5.9 ng/mL 29.2  CRP Latest Ref Range: <1.0 mg/dL 0.5  Vitamin B12 Latest Ref Range: 180-914 pg/mL 960 (H)  IgG (Immunoglobin G), Serum Latest Ref Range: 316-784-9878 mg/dL 984   IgA Latest Ref Range: 64-422 mg/dL 443 (H)  IgM, Serum Latest Ref Range: 26-217 mg/dL 94  WBC Latest Ref Range: 4.0-10.5 K/uL 4.0  RBC Latest Ref Range: 3.87-5.11 MIL/uL 4.70  Hemoglobin Latest Ref Range: 12.0-15.0 g/dL 14.2  HCT Latest Ref Range: 36.0-46.0 % 43.0  MCV Latest Ref Range: 78.0-100.0 fL 91.5  MCH Latest Ref Range: 26.0-34.0 pg 30.2  MCHC Latest Ref Range: 30.0-36.0 g/dL 33.0  RDW Latest Ref Range: 11.5-15.5 % 14.8  Platelets Latest Ref Range: 150-400 K/uL 42 (L)  Neutrophils Latest Units: % 74  Lymphocytes Latest Units: % 19  Monocytes Relative Latest Units: % 7  Eosinophil Latest Units: % 1  Basophil Latest Units: % 0  NEUT# Latest Ref Range: 1.7-7.7 K/uL 3.0  Lymphocyte # Latest Ref Range: 0.7-4.0 K/uL 0.8  Monocyte # Latest Ref Range: 0.1-1.0 K/uL 0.2  Eosinophils Absolute Latest Ref Range: 0.0-0.7 K/uL 0.0  Basophils Absolute Latest Ref Range: 0.0-0.1 K/uL 0.0  Smear Review Unknown SPECIMEN CHECKED ...  Sed Rate Latest Ref Range: 0-22 mm/hr 4  MISC [facility] TEST (SEND OUT) Unknown Rpt  [facility] test name Unknown SYSTEMIC LUPUS PR...  Misc [facility] result Unknown COMMENT  Path Review Unknown Reviewed By Michail Jewels...   Laboratory studies are reviewed and as detailed under reason for visit.  RADIOGRAPHIC STUDIES: I have personally reviewed the radiological images as listed and agreed with the findings in the report.   Study Result     CLINICAL DATA: Thrombocytopenia. Leukopenia. Elevated bili Rubin.  EXAM: ABDOMEN ULTRASOUND COMPLETE  COMPARISON: None.  FINDINGS: Gallbladder: Previous cholecystectomy.  Common bile duct: Diameter: 3.8 mm  Liver: The liver has a heterogeneous echotexture. The contour of the liver appears irregular.  IVC: No abnormality visualized.  Pancreas: Visualized portion unremarkable.  Spleen: Measures 17.2  cm in length and has a volume of 1646 cubic cm  Right Kidney: Length: 11.7 cm. Echogenicity within normal  limits. No mass or hydronephrosis visualized.  Left Kidney: Length: 12.1 cm. Echogenicity within normal limits. No mass or hydronephrosis visualized.  Abdominal aorta: No aneurysm visualized.  Other findings: None.  IMPRESSION: 1. Morphologic features of the liver suggestive of cirrhosis. If further investigation is clinically indicated hepatic elastography may be helpful. 2. Splenomegaly.   Electronically Signed  By: Kerby Moors M.D.  On: 09/07/2015 12:09      ASSESSMENT & PLAN:  Thrombocytopenia Platelet count on 07/07/2015 of 35,000, total white blood cell count 3,200 with mild lymphopenia Platelet count on 09/30/2014 of 51,000, total white blood count of 2,900, with mild lymphopenia. Platelet count on 02/08/2015 of 77,000, total white count of 3500, mild lymphopenia. Laboratory studies over the same time. Also show elevated total bilirubin of 2.3 mg/dL, 1.5 mg/dL, and 2 mg/dL Hypoalbuminemia, low total protein Cirrhosis Splenomegaly  80 year old female with long-standing thrombocytopenia and leukopenia. Records from [facility] were reviewed. It has been though she has ITP.  Review of her ultrasound shows cirrhosis and splenomegaly. I believe this is the most likely cause of her leukopenia and thrombocytopenia.  I have requested that she consider establishing with a GI physician for her underlying liver disease.   I have recommended repeat follow-up in 3 months with labs. If they are stable we can move her visits out to every 6 months.   Orders Placed This Encounter  Procedures  . CBC with Differential    Standing Status: Future     Number of Occurrences:      Standing Expiration Date: 09/07/2016   She will return in 3 months for a follow up.   All questions were answered. The patient knows to call the clinic with any problems, questions or concerns.  This document serves as a record of services personally performed by Ancil Linsey, MD. It was created on  her behalf by Kandace Blitz, a trained medical scribe. The creation of this record is based on the scribe's personal observations and the provider's statements to them. This document has been checked and approved by the attending provider.  I have reviewed the above documentation for accuracy and completeness, and I agree with the above.  This note was electronically signed.  Molli Hazard, MD  09/08/2015 1:14 PM

## 2015-09-08 NOTE — Patient Instructions (Addendum)
[facility] at [facility] Discharge Instructions  RECOMMENDATIONS MADE BY THE CONSULTANT AND ANY TEST RESULTS WILL BE SENT TO YOUR REFERRING PHYSICIAN.   Exam and discussion by Dr Galen ManilaPenland today Platelets for 42,000 Your ultrasound shows that you have some liver disease, there is some cirrhosis in your liver.  Your spleen is enlarged. 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  Try to find a therapist for counseling.  Talk to your Primary care physician about something for depression. You have to set realistic goals.  Return to see the doctor in 3 months with labs  Please call the clinic if you have any questions or concerns       Idiopathic Thrombocytopenic Purpura Idiopathic thrombocytopenic purpura (ITP) is a disease in which your body's defense system (immune system) attacks your platelets. Platelets are blood cells that help form clots and seal leaks in damaged blood vessels. With ITP, you to have too few platelets. As a result, you bleed more easily. It is also harder for your body to stop any bleeding. In adults, ITP is usually a long-term disease. CAUSES The cause is unknown. ITP may develop after a viral infection, during pregnancy, or from an immune disorder. RISK FACTORS The risk of ITP may be greater among:  Women.  Adults 7020-85 years old. SIGNS AND SYMPTOMS Common signs and symptoms include:  Easy bruising.  A cut that bleeds for a long time.  Tiny purple blood dots (petechiae) under the skin, especially on the shins.  Blood in  urine or bowel movements.  Nosebleeds.  Bleeding gums.  Heavy menstrual periods. Mild forms of ITP may not cause any symptoms. DIAGNOSIS Your health care provider may suspect ITP based on your signs and symptoms. To make a diagnosis, your health care provider may do a physical exam and order blood tests to:  Find how many platelets you have.  See how well your blood clots. Your health care provider may then do blood tests or a bone marrow test to rule out other conditions that could be causing your symptoms. TREATMENT Treatment depends on the severity of your condition. Options include:  Monitoring of your condition and platelet count.  Blood transfusions of antibodies or platelets.  Medicines such as:  Strong anti-inflammatory medicines (steroids).  Medicines to boost platelet production.  Medicines to suppress your immune system.  Removal of your spleen. This may be done if other treatments do not work. HOME CARE INSTRUCTIONS  Take medicines only as directed by your health care provider.  Do not take over-the-counter medicines that lower your platelet count, affect platelet function, or affect your blood's ability to clot. These include aspirin and ibuprofen.  Do not participate in contact sports or other high-risk activities. Ask your health care provider which activities are safe for you.  Keep all follow-up visits as directed by your health care provider. This is important. SEEK MEDICAL CARE IF:  You have new symptoms.  Your symptoms get worse. SEEK IMMEDIATE MEDICAL CARE IF:  You have a sudden  severe headache or confusion.  You have significant bleeding.  You have nausea and vomiting.   This information is not intended to replace advice given to you by your health care provider. Make sure you discuss any questions you have with your health care provider.   Document Released: 12/23/2013 Document Reviewed: 12/23/2013 Elsevier Interactive Patient Education  2016 [employer].    Thank you for choosing [facility] at [facility] to provide your oncology and hematology care.  To afford each patient quality time with our provider, please arrive at least 15 minutes before your scheduled appointment time.   Beginning January 23rd 2017 lab work for the [facility] will be done in the  Main lab at [facility] on 1st floor. If you have a lab appointment with the Cancer Center please come in thru the  Main Entrance and check in at the main information desk  You need to re-schedule your appointment should you arrive 10 or more minutes late.  We strive to give you quality time with our providers, and arriving late affects you and other patients whose appointments are after yours.  Also, if you no show three or more times for appointments you may be dismissed from the clinic at the providers discretion.     Again, thank you for choosing [facility].  Our hope is that these requests will decrease the amount of time that you wait before being seen by our physicians.       _____________________________________________________________  Should you have questions after your visit to [facility], please contact our office at [phone] between the hours of 8:30 a.m. and 4:30 p.m.  Voicemails left after 4:30 p.m. will not be returned until the following business day.  For prescription refill requests, have your pharmacy contact our office.         Resources For Cancer Patients and their Caregivers ? American Cancer Society: Can assist with transportation, wigs, general needs, runs Look Good Feel Better.        718-691-9183 ? Cancer Care: Provides financial assistance, online support groups, medication/co-pay assistance.  1-800-813-HOPE (732)291-1468) ? [facility] Assists [place] cancer patients and their families through emotional , educational and financial support.   (917)308-3335 ? [place] DSS Where to apply for food stamps, Medicaid and utility assistance. 419-310-2718 ? [facility]: Transportation to medical appointments. 609-761-5687 ? Social Security Administration: May apply for disability if have a Stage IV cancer. 470-368-5286 (804)409-5893 ? [employer], Disability and Transit Services: Assists with nutrition, care and transit needs. 947-535-4599

## 2015-12-09 ENCOUNTER — Other Ambulatory Visit (HOSPITAL_COMMUNITY): Payer: Medicare Other

## 2015-12-09 ENCOUNTER — Ambulatory Visit (HOSPITAL_COMMUNITY): Payer: Medicare Other | Admitting: Hematology & Oncology

## 2015-12-20 ENCOUNTER — Other Ambulatory Visit (HOSPITAL_COMMUNITY): Payer: Self-pay | Admitting: Oncology

## 2015-12-20 ENCOUNTER — Telehealth (HOSPITAL_COMMUNITY): Payer: Self-pay | Admitting: Oncology

## 2015-12-20 DIAGNOSIS — K7460 Unspecified cirrhosis of liver: Secondary | ICD-10-CM

## 2015-12-20 NOTE — Telephone Encounter (Signed)
Dr. Myrle ShengFraifeld [place].  He reports that she has a compression fracture and would benefit from kyphoplasty, but he is concerned about the patient's platelet count and his reporting of a PT of 17 (likely secondary to her underlying liver disease).  He is interested in a repeat PT when she is seen in the clinic on 7/18 and therefore, I have placed the order and linked the order to her lab appointment.  He requests a copy of this lab result and Dr. Chiquita LothPenland's note.  His fax # is [phone].  Dellis AnesKEFALAS,THOMAS, PA-C 12/20/2015 11:31 AM

## 2015-12-27 ENCOUNTER — Other Ambulatory Visit (HOSPITAL_COMMUNITY): Payer: Medicare Other

## 2015-12-27 ENCOUNTER — Ambulatory Visit (HOSPITAL_COMMUNITY): Payer: Medicare Other | Admitting: Hematology & Oncology

## 2016-02-06 ENCOUNTER — Other Ambulatory Visit (HOSPITAL_COMMUNITY): Payer: Medicare Other

## 2016-02-06 ENCOUNTER — Ambulatory Visit (HOSPITAL_COMMUNITY): Payer: Medicare Other | Admitting: Hematology & Oncology

## 2016-03-05 ENCOUNTER — Ambulatory Visit (HOSPITAL_COMMUNITY): Payer: Medicare Other | Admitting: Hematology & Oncology

## 2016-03-05 ENCOUNTER — Other Ambulatory Visit (HOSPITAL_COMMUNITY): Payer: Medicare Other

## 2017-06-11 DEATH — deceased

## 2017-07-08 IMAGING — US US ABDOMEN COMPLETE
1 series · 14 of 25 positions shown · non-contrast
Comparison: None.

CLINICAL DATA: Thrombocytopenia.  Leukopenia.  Elevated bili Rubin.

EXAM:
ABDOMEN ULTRASOUND COMPLETE

[Series 1: us abdomen complete · 0.16mm/px · 14 of 114 slices shown]
[im 1/114]
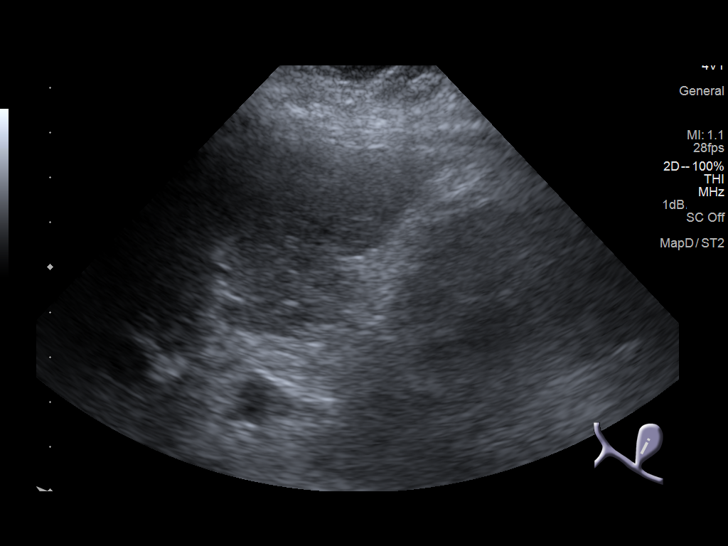
[im 10/114]
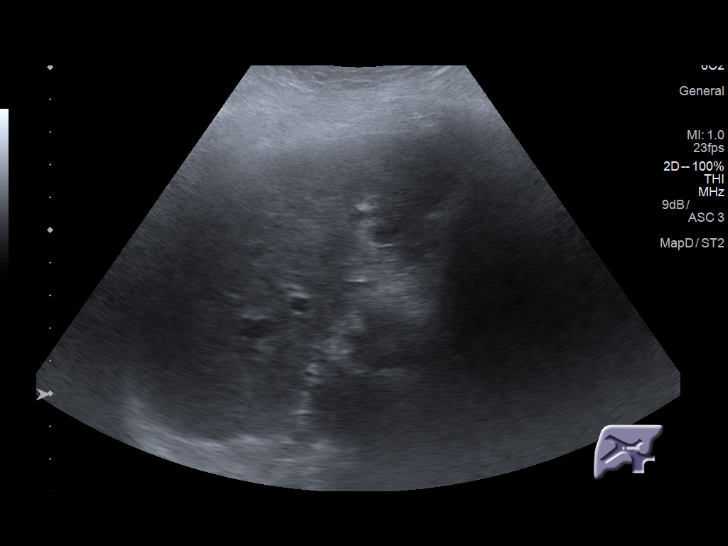
[im 19/114]
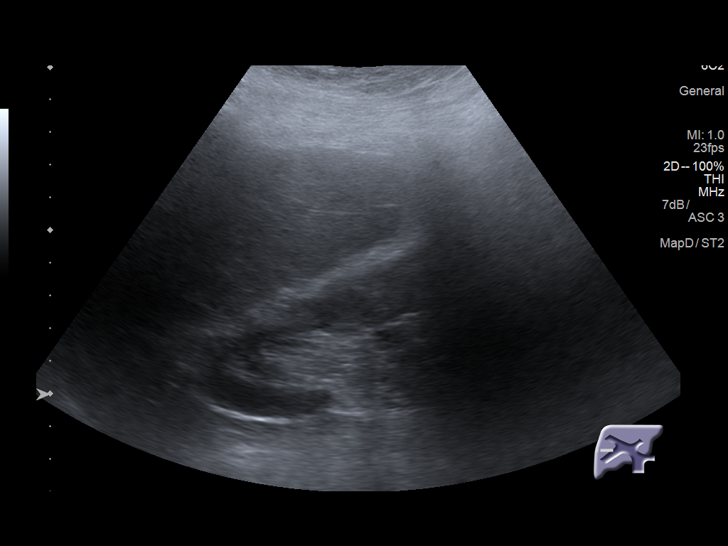
[im 29/114]
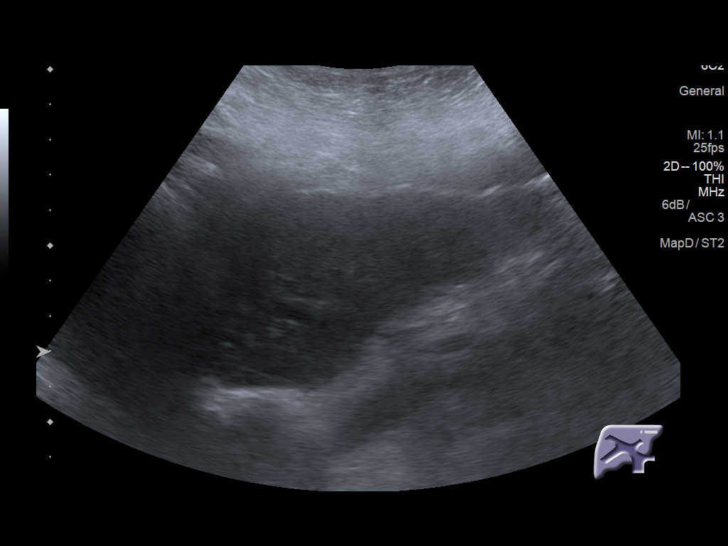
[im 38/114]
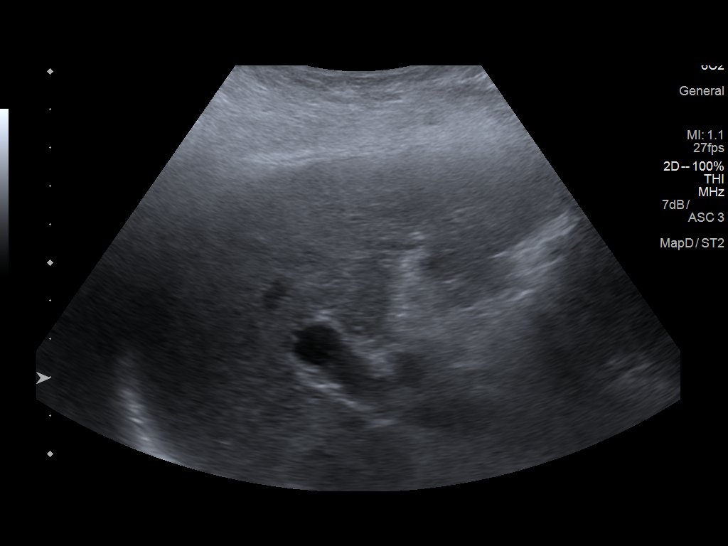
[im 43/114]
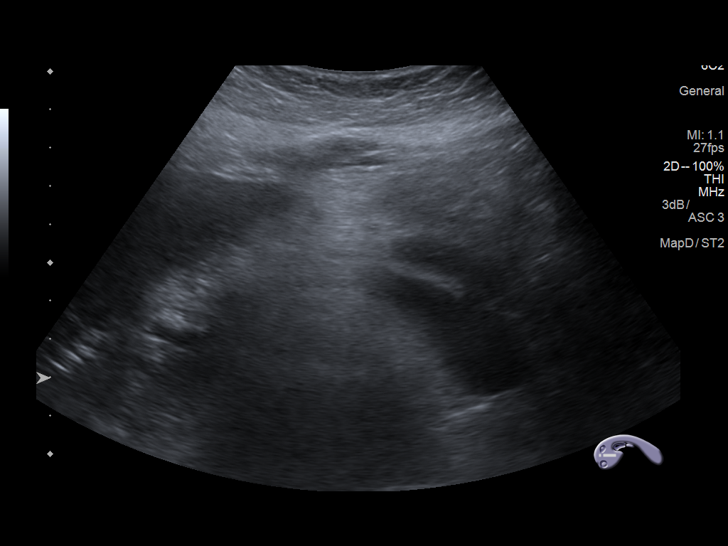
[im 52/114]
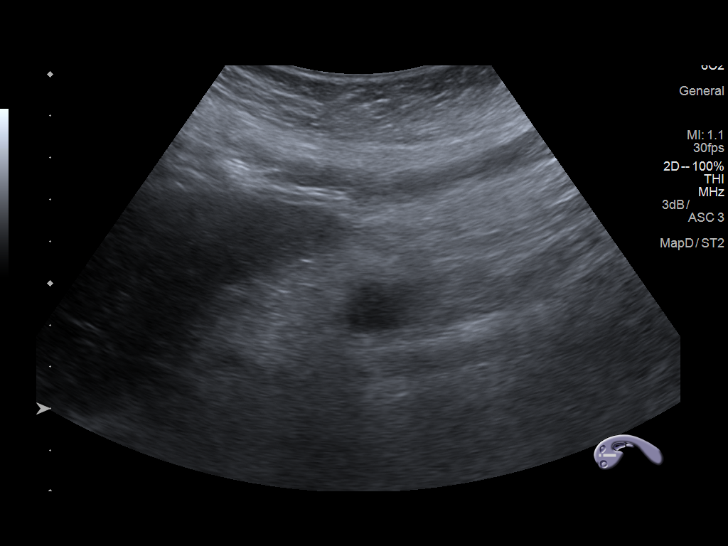
[im 62/114]
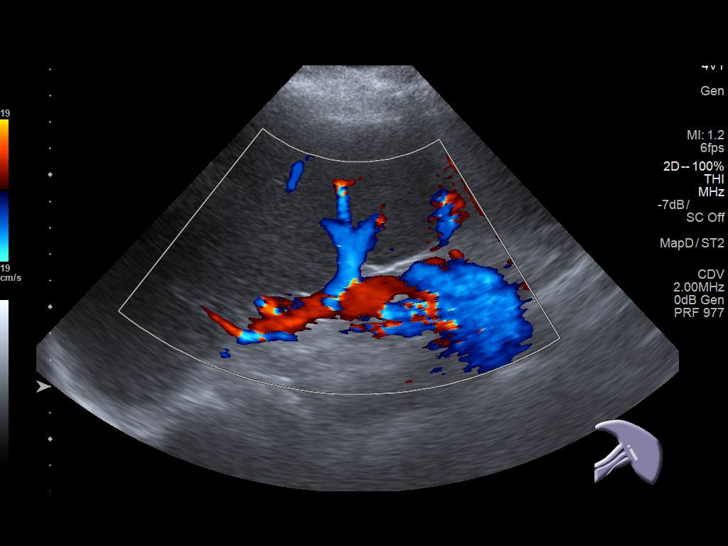
[im 71/114]
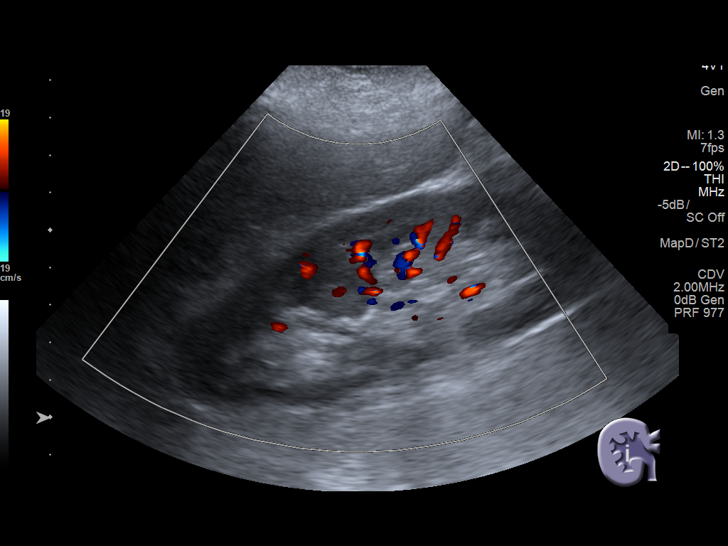
[im 76/114]
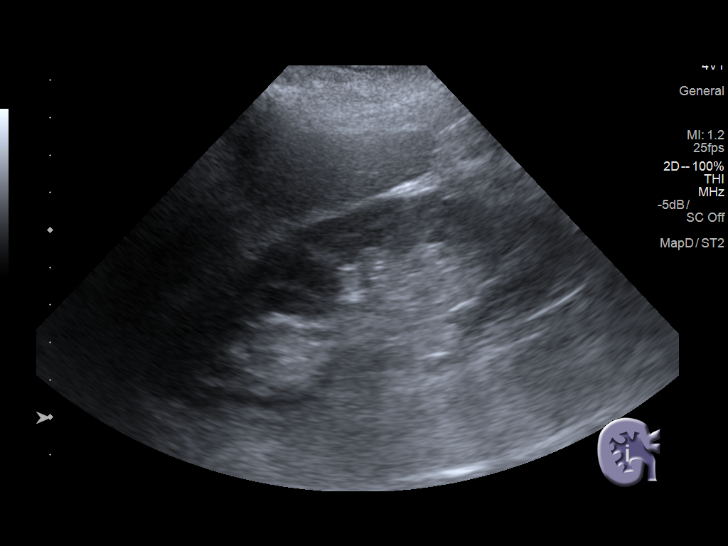
[im 85/114]
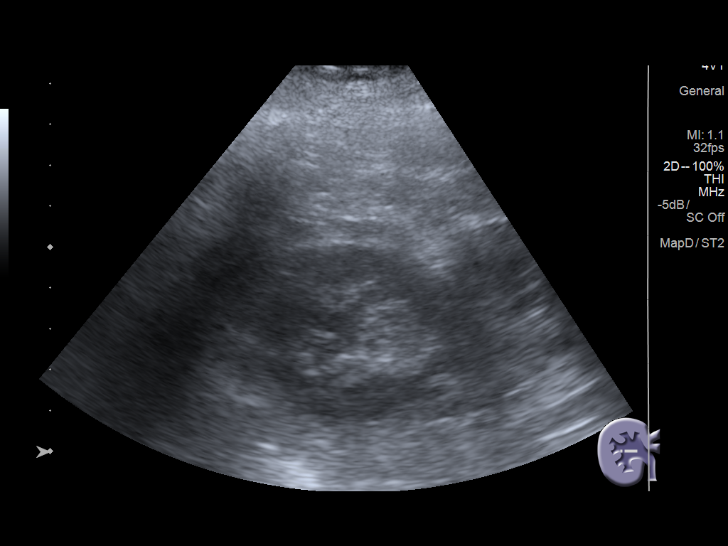
[im 95/114]
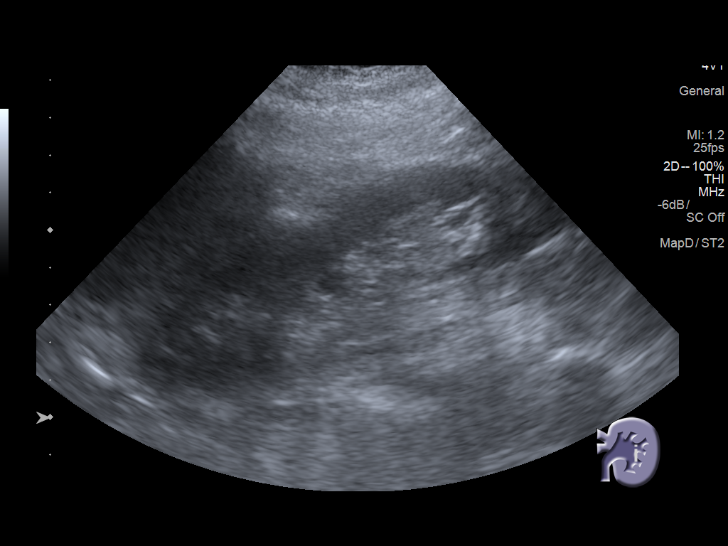
[im 104/114]
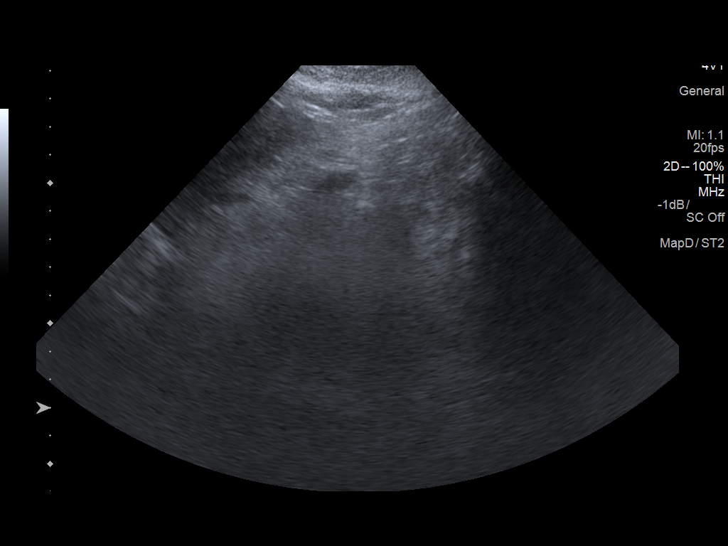
[im 114/114]
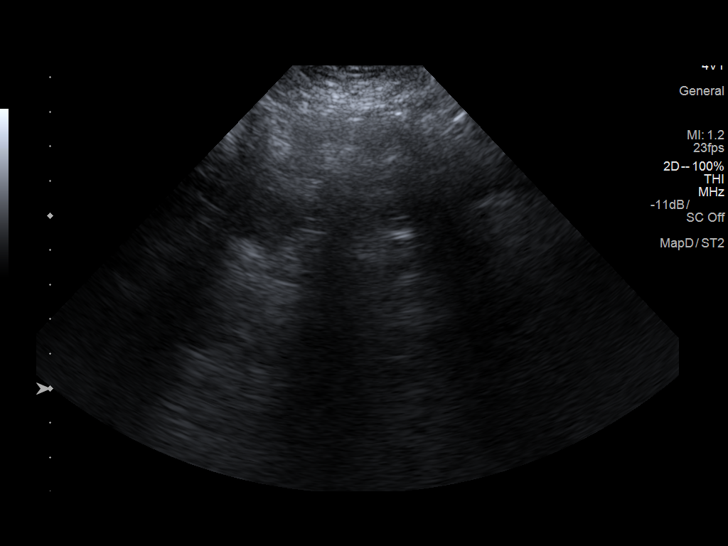

[14 of 25 positions shown; findings below may reference images not displayed]

FINDINGS: Gallbladder: Previous cholecystectomy.

Common bile duct: Diameter: 3.8 mm

Liver: The liver has a heterogeneous echotexture. The contour of the
liver appears irregular.

IVC: No abnormality visualized.

Pancreas: Visualized portion unremarkable.

Spleen: Measures 17.2 cm in length and has a volume of 7909 cubic cm

Right Kidney: Length: 11.7 cm. Echogenicity within normal limits. No
mass or hydronephrosis visualized.

Left Kidney: Length: 12.1 cm. Echogenicity within normal limits. No
mass or hydronephrosis visualized.

Abdominal aorta: No aneurysm visualized.

Other findings: None.
IMPRESSION: 1. Morphologic features of the liver suggestive of cirrhosis. If
further investigation is clinically indicated hepatic elastography
may be helpful.
2. Splenomegaly.
